# Patient Record
Sex: Female | Born: 1960 | Race: Black or African American | Hispanic: No | Smoking: Never smoker
Health system: Southern US, Community
[De-identification: ages and names within clinical notes are randomized; demographics above are authoritative.]

## PROBLEM LIST (undated history)

## (undated) DIAGNOSIS — K449 Diaphragmatic hernia without obstruction or gangrene: Secondary | ICD-10-CM

## (undated) DIAGNOSIS — I10 Essential (primary) hypertension: Secondary | ICD-10-CM

## (undated) HISTORY — PX: THYROID CYST EXCISION: SHX2511

---

## 1999-04-02 ENCOUNTER — Other Ambulatory Visit: Admission: RE | Admit: 1999-04-02 | Discharge: 1999-04-02 | Payer: Self-pay | Admitting: Obstetrics and Gynecology

## 2002-09-24 ENCOUNTER — Other Ambulatory Visit: Admission: RE | Admit: 2002-09-24 | Discharge: 2002-09-24 | Payer: Self-pay | Admitting: Obstetrics and Gynecology

## 2003-10-02 ENCOUNTER — Other Ambulatory Visit: Admission: RE | Admit: 2003-10-02 | Discharge: 2003-10-02 | Payer: Self-pay | Admitting: Obstetrics and Gynecology

## 2004-10-26 ENCOUNTER — Other Ambulatory Visit: Admission: RE | Admit: 2004-10-26 | Discharge: 2004-10-26 | Payer: Self-pay | Admitting: Obstetrics and Gynecology

## 2005-02-01 ENCOUNTER — Other Ambulatory Visit: Admission: RE | Admit: 2005-02-01 | Discharge: 2005-02-01 | Payer: Self-pay | Admitting: Obstetrics and Gynecology

## 2005-08-02 ENCOUNTER — Other Ambulatory Visit: Admission: RE | Admit: 2005-08-02 | Discharge: 2005-08-02 | Payer: Self-pay | Admitting: Obstetrics and Gynecology

## 2015-01-16 ENCOUNTER — Emergency Department (HOSPITAL_BASED_OUTPATIENT_CLINIC_OR_DEPARTMENT_OTHER): Payer: 59

## 2015-01-16 ENCOUNTER — Emergency Department (HOSPITAL_BASED_OUTPATIENT_CLINIC_OR_DEPARTMENT_OTHER)
Admission: EM | Admit: 2015-01-16 | Discharge: 2015-01-17 | Disposition: A | Discharge status: Home / Self Care | Payer: 59 | Attending: Emergency Medicine | Admitting: Emergency Medicine

## 2015-01-16 ENCOUNTER — Encounter (HOSPITAL_BASED_OUTPATIENT_CLINIC_OR_DEPARTMENT_OTHER): Payer: Self-pay

## 2015-01-16 DIAGNOSIS — K868 Other specified diseases of pancreas: Secondary | ICD-10-CM | POA: Insufficient documentation

## 2015-01-16 DIAGNOSIS — Z79899 Other long term (current) drug therapy: Secondary | ICD-10-CM | POA: Diagnosis not present

## 2015-01-16 DIAGNOSIS — I10 Essential (primary) hypertension: Secondary | ICD-10-CM | POA: Diagnosis not present

## 2015-01-16 DIAGNOSIS — K8689 Other specified diseases of pancreas: Secondary | ICD-10-CM

## 2015-01-16 DIAGNOSIS — R0781 Pleurodynia: Secondary | ICD-10-CM | POA: Insufficient documentation

## 2015-01-16 DIAGNOSIS — Z3202 Encounter for pregnancy test, result negative: Secondary | ICD-10-CM | POA: Insufficient documentation

## 2015-01-16 DIAGNOSIS — R1011 Right upper quadrant pain: Secondary | ICD-10-CM

## 2015-01-16 HISTORY — DX: Diaphragmatic hernia without obstruction or gangrene: K44.9

## 2015-01-16 HISTORY — DX: Essential (primary) hypertension: I10

## 2015-01-16 LAB — CBC
HEMATOCRIT: 41.6 % (ref 36.0–46.0)
HEMOGLOBIN: 13.9 g/dL (ref 12.0–15.0)
MCH: 26.9 pg (ref 26.0–34.0)
MCHC: 33.4 g/dL (ref 30.0–36.0)
MCV: 80.6 fL (ref 78.0–100.0)
PLATELETS: 264 10*3/uL (ref 150–400)
RBC: 5.16 MIL/uL — ABNORMAL HIGH (ref 3.87–5.11)
RDW: 14.9 % (ref 11.5–15.5)
WBC: 6.7 10*3/uL (ref 4.0–10.5)

## 2015-01-16 LAB — COMPREHENSIVE METABOLIC PANEL
ALBUMIN: 3.7 g/dL (ref 3.5–5.0)
ALT: 8 U/L — ABNORMAL LOW (ref 14–54)
AST: 16 U/L (ref 15–41)
Alkaline Phosphatase: 48 U/L (ref 38–126)
Anion gap: 7 (ref 5–15)
BILIRUBIN TOTAL: 0.3 mg/dL (ref 0.3–1.2)
BUN: 15 mg/dL (ref 6–20)
CO2: 26 mmol/L (ref 22–32)
Calcium: 8.8 mg/dL — ABNORMAL LOW (ref 8.9–10.3)
Chloride: 103 mmol/L (ref 101–111)
Creatinine, Ser: 0.89 mg/dL (ref 0.44–1.00)
GFR calc non Af Amer: >60 mL/min (ref >60)
Glucose, Bld: 158 mg/dL — ABNORMAL HIGH (ref 65–99)
POTASSIUM: 3.5 mmol/L (ref 3.5–5.1)
SODIUM: 136 mmol/L (ref 135–145)
Total Protein: 6.6 g/dL (ref 6.5–8.1)

## 2015-01-16 LAB — D-DIMER, QUANTITATIVE (NOT AT ARMC): D-Dimer, Quant: <0.27 ug/mL-FEU (ref 0.00–0.48)

## 2015-01-16 LAB — URINALYSIS, ROUTINE W REFLEX MICROSCOPIC
Bilirubin Urine: NEGATIVE
GLUCOSE, UA: NEGATIVE mg/dL
Hgb urine dipstick: NEGATIVE
KETONES UR: NEGATIVE mg/dL
Leukocytes, UA: NEGATIVE
NITRITE: NEGATIVE
Protein, ur: NEGATIVE mg/dL
Specific Gravity, Urine: 1.015 (ref 1.005–1.030)
UROBILINOGEN UA: 1 mg/dL (ref 0.0–1.0)
pH: 6.5 (ref 5.0–8.0)

## 2015-01-16 LAB — PREGNANCY, URINE: Preg Test, Ur: NEGATIVE

## 2015-01-16 LAB — LIPASE, BLOOD: LIPASE: 20 U/L — AB (ref 22–51)

## 2015-01-16 MED ORDER — ONDANSETRON HCL 4 MG/2ML IJ SOLN
4.0000 mg | Freq: Once | INTRAMUSCULAR | Status: AC
  Administered 2015-01-16: 4 mg via INTRAVENOUS
  Filled 2015-01-16: qty 2

## 2015-01-16 MED ORDER — IOHEXOL 300 MG/ML  SOLN
25.0000 mL | Freq: Once | INTRAMUSCULAR | Status: AC | PRN
  Administered 2015-01-16: 25 mL via ORAL

## 2015-01-16 MED ORDER — SODIUM CHLORIDE 0.9 % IV SOLN
Freq: Once | INTRAVENOUS | Status: AC
  Administered 2015-01-16: 23:00:00 via INTRAVENOUS

## 2015-01-16 MED ORDER — IOHEXOL 350 MG/ML SOLN
100.0000 mL | Freq: Once | INTRAVENOUS | Status: AC | PRN
  Administered 2015-01-16: 100 mL via INTRAVENOUS

## 2015-01-16 MED ORDER — MORPHINE SULFATE 4 MG/ML IJ SOLN
4.0000 mg | Freq: Once | INTRAMUSCULAR | Status: AC
Start: 2015-01-16 — End: 2015-01-16
  Administered 2015-01-16: 4 mg via INTRAVENOUS
  Filled 2015-01-16: qty 1

## 2015-01-16 MED ORDER — IOHEXOL 300 MG/ML  SOLN: 100.0000 mL | Freq: Once | INTRAMUSCULAR | Status: AC | PRN

## 2015-01-16 NOTE — ED Provider Notes (Signed)
CSN: [id number]     Arrival date & time 01/16/15  2111 History  This chart was scribed for Glynn Octave, MD by Budd Palmer, ED Scribe. This patient was seen in room MH03/MH03 and the patient's care was started at 10:09 PM.    Chief Complaint  Patient presents with  . Abdominal Pain   The history is provided by the patient. No language interpreter was used.   HPI Comments: Jasmine Lewis is a 54 y.o. female with a PMHx of HTN and hiatal hernia who presents to the Emergency Department complaining of worsening, intermittent, pressure-like RUQ abdominal pain onset 07/04. She reports associated nausea. During the day and while sitting it does not bother her, but movement, deep breathing, and eating exacerbate the pain. She has taken ibuprofen with moderate relief at onset, but then the pain returned. At this point she went to her PCP at [facility] where she had a US done, which was normal. Her PCP would like to do a CT scan. She has a PMHx of GERD. She is on an IUD for [facility]. Pt denies CP, vomiting, fever, diarrhea, dysuria, and hematuria. She has NKDA.  Past Medical History  Diagnosis Date  . Hypertension   . Hiatal hernia    Past Surgical History  Procedure Laterality Date  . Thyroid cyst excision     No family history on file. History  Substance Use Topics  . Smoking status: Never Smoker   . Smokeless tobacco: Not on file  . Alcohol Use: 2.4 oz/week    4 Glasses of wine per week   OB History    No data available     Review of Systems A complete 10 system review of systems was obtained and all systems are negative except as noted in the HPI and PMH.   Allergies  Review of patient's allergies indicates no known allergies.  Home Medications   Prior to Admission medications   Medication Sig Start Date End Date Taking? Authorizing Provider  HYDROcodone-acetaminophen (NORCO/VICODIN) 5-325 MG per tablet Take 1 tablet by mouth every 4 (four) hours as needed. 01/17/15    Glynn Octave, MD  metoprolol (LOPRESSOR) 50 MG tablet Take 50 mg by mouth 2 (two) times daily.   Yes Historical Provider, MD  omeprazole (PRILOSEC) 20 MG capsule Take 1 capsule (20 mg total) by mouth daily. 01/17/15   Glynn Octave, MD  ondansetron (ZOFRAN) 4 MG tablet Take 1 tablet (4 mg total) by mouth every 6 (six) hours. 01/17/15   Glynn Octave, MD  traMADol (ULTRAM) 50 MG tablet Take by mouth every 6 (six) hours as needed.   Yes Historical Provider, MD   BP 146/76 mmHg  Pulse 78  Temp(Src) 98.5 F (36.9 C) (Oral)  Resp 20  Ht 5\' 4"  (1.626 m)  Wt 170 lb (77.111 kg)  BMI 29.17 kg/m2  SpO2 96%  LMP 12/15/2014 Physical Exam  Constitutional: She is oriented to person, place, and time. She appears well-developed and well-nourished. No distress.  HENT:  Head: Normocephalic and atraumatic.  Mouth/Throat: Oropharynx is clear and moist. No oropharyngeal exudate.  Eyes: Conjunctivae and EOM are normal. Pupils are equal, round, and reactive to light.  Neck: Normal range of motion. Neck supple.  No meningismus.  Cardiovascular: Normal rate, regular rhythm, normal heart sounds and intact distal pulses.   No murmur heard. Pulmonary/Chest: Effort normal and breath sounds normal. No respiratory distress.  Abdominal: Soft. There is tenderness. There is guarding. There is no rebound.  RUQ tenderness with  guarding  Musculoskeletal: Normal range of motion. She exhibits tenderness. She exhibits no edema.  Tenderness to right lower ribs. No CVA tenderness.  Neurological: She is alert and oriented to person, place, and time. No cranial nerve deficit. She exhibits normal muscle tone. Coordination normal.  No ataxia on finger to nose bilaterally. No pronator drift. 5/5 strength throughout. CN 2-12 intact. Negative Romberg. Equal grip strength. Sensation intact. Gait is normal.   Skin: Skin is warm.  Psychiatric: She has a normal mood and affect. Her behavior is normal.  Nursing note and vitals  reviewed.   ED Course  Procedures  DIAGNOSTIC STUDIES: Oxygen Saturation is 98% on RA, normal by my interpretation.    COORDINATION OF CARE: 10:15 PM - Discussed plans to order diagnostic studies, as well as anti-nausea medication. Pt advised of plan for treatment and pt agrees.  Labs Review Labs Reviewed  LIPASE, BLOOD - Abnormal; Notable for the following:    Lipase 20 (*)    All other components within normal limits  COMPREHENSIVE METABOLIC PANEL - Abnormal; Notable for the following:    Glucose, Bld 158 (*)    Calcium 8.8 (*)    ALT 8 (*)    All other components within normal limits  CBC - Abnormal; Notable for the following:    RBC 5.16 (*)    All other components within normal limits  URINALYSIS, ROUTINE W REFLEX MICROSCOPIC (NOT AT [facility])  PREGNANCY, URINE  D-DIMER, QUANTITATIVE (NOT AT [facility])    Imaging Review Ct Angio Chest Pe W/cm &/or Wo Cm  01/16/2015   CLINICAL DATA:  Subacute onset of right upper quadrant abdominal pain for 17 days. Nausea. Initial encounter.  EXAM: CT ANGIOGRAPHY CHEST  CT ABDOMEN AND PELVIS WITH CONTRAST  TECHNIQUE: Multidetector CT imaging of the chest was performed using the standard protocol during bolus administration of intravenous contrast. Multiplanar CT image reconstructions and MIPs were obtained to evaluate the vascular anatomy. Multidetector CT imaging of the abdomen and pelvis was performed using the standard protocol during bolus administration of intravenous contrast.  CONTRAST:  OMNIPAQUE IOHEXOL 350 MG/ML SOLN  COMPARISON:  None.  FINDINGS: CTA CHEST FINDINGS  There is no evidence of pulmonary embolus.  Minimal bilateral dependent subsegmental atelectasis is noted. The lungs are otherwise clear. There is no evidence of significant focal consolidation, pleural effusion or pneumothorax. No masses are identified; no abnormal focal contrast enhancement is seen.  The mediastinum is unremarkable in appearance. No mediastinal  lymphadenopathy is seen. No pericardial effusion is identified. The great vessels are grossly unremarkable. No axillary lymphadenopathy is seen. The visualized portions of the thyroid gland are unremarkable in appearance.  The visualized portions of the liver are unremarkable.  CT ABDOMEN and PELVIS FINDINGS  A few small hypodensities are noted within the liver, measuring up to 5 mm in size. These are nonspecific. The spleen is unremarkable in appearance. The gallbladder is within normal limits.  There is dilatation of the pancreatic duct at the level of the pancreatic head to 6 mm in diameter, of uncertain significance. The remainder of the pancreatic duct is grossly unremarkable. The pancreas and adrenal glands are otherwise unremarkable in appearance.  Mild scarring is noted at the interpole region of the right kidney. The kidneys are otherwise unremarkable. There is no evidence of hydronephrosis. No renal or ureteral stones are seen. No perinephric stranding is appreciated.  No free fluid is identified. The small bowel is unremarkable in appearance. The stomach is within normal limits.  No acute vascular abnormalities are seen.  The appendix is normal in caliber and contains air, without evidence for appendicitis. The colon is unremarkable in appearance.  The bladder is mildly distended and grossly unremarkable. The uterus is significantly enlarged, containing multiple fibroids. An intrauterine device is noted in expected position at the fundus of the uterus. The right ovary is unremarkable in appearance. A 3.6 cm left adnexal cystic lesion is noted, nonspecific in appearance. No inguinal lymphadenopathy is seen.  No acute osseous abnormalities are identified.  Review of the MIP images confirms the above findings.  IMPRESSION: 1. No evidence of pulmonary embolus. 2. Dilatation of the pancreatic duct at the level of the pancreatic duct to 6 mm in diameter, of uncertain significance. The remainder of the pancreatic  duct is grossly unremarkable. Would correlate with pancreatic labs. 3. Minimal bilateral dependent subsegmental atelectasis noted; lungs otherwise clear. 4. Few small nonspecific hypodensities within the liver, measuring up to 5 mm in size. 5. Mild scarring at the interpole region of the right kidney. 6. Significantly enlarged fibroid uterus. Intrauterine device noted in expected position at the fundus of the uterus. 7. 3.6 cm left adnexal cystic lesion, nonspecific in appearance. Pelvic ultrasound could be considered for further evaluation, on an elective nonemergent basis.   Electronically Signed   By: Roanna Raider M.D.   On: 01/16/2015 23:42   Ct Abdomen Pelvis W Contrast  01/16/2015   CLINICAL DATA:  Subacute onset of right upper quadrant abdominal pain for 17 days. Nausea. Initial encounter.  EXAM: CT ANGIOGRAPHY CHEST  CT ABDOMEN AND PELVIS WITH CONTRAST  TECHNIQUE: Multidetector CT imaging of the chest was performed using the standard protocol during bolus administration of intravenous contrast. Multiplanar CT image reconstructions and MIPs were obtained to evaluate the vascular anatomy. Multidetector CT imaging of the abdomen and pelvis was performed using the standard protocol during bolus administration of intravenous contrast.  CONTRAST:  OMNIPAQUE IOHEXOL 350 MG/ML SOLN  COMPARISON:  None.  FINDINGS: CTA CHEST FINDINGS  There is no evidence of pulmonary embolus.  Minimal bilateral dependent subsegmental atelectasis is noted. The lungs are otherwise clear. There is no evidence of significant focal consolidation, pleural effusion or pneumothorax. No masses are identified; no abnormal focal contrast enhancement is seen.  The mediastinum is unremarkable in appearance. No mediastinal lymphadenopathy is seen. No pericardial effusion is identified. The great vessels are grossly unremarkable. No axillary lymphadenopathy is seen. The visualized portions of the thyroid gland are unremarkable in  appearance.  The visualized portions of the liver are unremarkable.  CT ABDOMEN and PELVIS FINDINGS  A few small hypodensities are noted within the liver, measuring up to 5 mm in size. These are nonspecific. The spleen is unremarkable in appearance. The gallbladder is within normal limits.  There is dilatation of the pancreatic duct at the level of the pancreatic head to 6 mm in diameter, of uncertain significance. The remainder of the pancreatic duct is grossly unremarkable. The pancreas and adrenal glands are otherwise unremarkable in appearance.  Mild scarring is noted at the interpole region of the right kidney. The kidneys are otherwise unremarkable. There is no evidence of hydronephrosis. No renal or ureteral stones are seen. No perinephric stranding is appreciated.  No free fluid is identified. The small bowel is unremarkable in appearance. The stomach is within normal limits. No acute vascular abnormalities are seen.  The appendix is normal in caliber and contains air, without evidence for appendicitis. The colon is unremarkable in appearance.  The bladder is mildly distended and grossly unremarkable. The uterus is significantly enlarged, containing multiple fibroids. An intrauterine device is noted in expected position at the fundus of the uterus. The right ovary is unremarkable in appearance. A 3.6 cm left adnexal cystic lesion is noted, nonspecific in appearance. No inguinal lymphadenopathy is seen.  No acute osseous abnormalities are identified.  Review of the MIP images confirms the above findings.  IMPRESSION: 1. No evidence of pulmonary embolus. 2. Dilatation of the pancreatic duct at the level of the pancreatic duct to 6 mm in diameter, of uncertain significance. The remainder of the pancreatic duct is grossly unremarkable. Would correlate with pancreatic labs. 3. Minimal bilateral dependent subsegmental atelectasis noted; lungs otherwise clear. 4. Few small nonspecific hypodensities within the  liver, measuring up to 5 mm in size. 5. Mild scarring at the interpole region of the right kidney. 6. Significantly enlarged fibroid uterus. Intrauterine device noted in expected position at the fundus of the uterus. 7. 3.6 cm left adnexal cystic lesion, nonspecific in appearance. Pelvic ultrasound could be considered for further evaluation, on an elective nonemergent basis.   Electronically Signed   By: Roanna Raider M.D.   On: 01/16/2015 23:42     EKG Interpretation   Date/Time:  Thursday January 16 2015 22:26:04 EDT Ventricular Rate:  76 PR Interval:  154 QRS Duration: 74 QT Interval:  388 QTC Calculation: 436 R Axis:   57 Text Interpretation:  Normal sinus rhythm Normal ECG No previous ECGs  available Confirmed by Yuritza Paulhus  MD, Allie Ousley [phone]) on 01/16/2015 10:52:18  PM      MDM   Final diagnoses:  RUQ pain  Dilated pancreatic duct  right upper quadrant pain since July 4 seen by PCP who did blood work. Ultrasound completed on July 19 showed no gallstones and no gallbladder wall thickening.  Patient reports severe pain with palpation and pain along her lower ribs worse with deep breathing.  LFTs and lipase normal. [place] reviewed from [facility] on 7/19.  No gallstones or gallbladder wall thickening.   CT findings d/w Dr. Randa Evens of GI.  He feels likely still gallbladder pathology and recommends HIDA scan. No evidence of cholangitis. Dilated pancreatic duct uncertain significance. Normal LFTs and lipase. Dr. Randa Evens feels patient would benefit from endoscopy and GI follow-up she may also need to have an MRI of her abdomen.  Will treat pain, start PPI, start anti-enteric. Discussed with patient. Return precautions discussed.  I personally performed the services described in this documentation, which was scribed in my presence. The recorded information has been reviewed and is accurate.   Glynn Octave, MD 01/17/15 [phone]

## 2015-01-16 NOTE — ED Notes (Signed)
Patient reports abdominal pain began 7/4 and was seen by her PCP who did blood work and had an [place] completed this week which was normal. Patient reports increased pain at night. Patient is taking Tramadol and prednisone for pain as well. Patient complains of RUQ pain.

## 2015-01-17 MED ORDER — ONDANSETRON HCL 4 MG PO TABS: 4.0000 mg | ORAL_TABLET | Freq: Four times a day (QID) | ORAL | Status: AC

## 2015-01-17 MED ORDER — OMEPRAZOLE 20 MG PO CPDR: 20.0000 mg | DELAYED_RELEASE_CAPSULE | Freq: Every day | ORAL | Status: AC

## 2015-01-17 MED ORDER — HYDROCODONE-ACETAMINOPHEN 5-325 MG PO TABS: 1.0000 | ORAL_TABLET | ORAL | Status: AC | PRN

## 2015-01-17 NOTE — Discharge Instructions (Signed)
Abdominal Pain As we discussed, follow up with the stomach doctor for a HIDA scan, EGD and possible MRI of your pancreas.  Take the prilosec as prescribed. Return to the ED if you develop new or worsening symptoms. Many things can cause abdominal pain. Usually, abdominal pain is not caused by a disease and will improve without treatment. It can often be observed and treated at home. Your health care provider will do a physical exam and possibly order blood tests and X-rays to help determine the seriousness of your pain. However, in many cases, more time must pass before a clear cause of the pain can be found. Before that point, your health care provider may not know if you need more testing or further treatment. HOME CARE INSTRUCTIONS  Monitor your abdominal pain for any changes. The following actions may help to alleviate any discomfort you are experiencing:  Only take over-the-counter or prescription medicines as directed by your health care provider.  Do not take laxatives unless directed to do so by your health care provider.  Try a clear liquid diet (broth, tea, or water) as directed by your health care provider. Slowly move to a bland diet as tolerated. SEEK MEDICAL CARE IF:  You have unexplained abdominal pain.  You have abdominal pain associated with nausea or diarrhea.  You have pain when you urinate or have a bowel movement.  You experience abdominal pain that wakes you in the night.  You have abdominal pain that is worsened or improved by eating food.  You have abdominal pain that is worsened with eating fatty foods.  You have a fever. SEEK IMMEDIATE MEDICAL CARE IF:   Your pain does not go away within 2 hours.  You keep throwing up (vomiting).  Your pain is felt only in portions of the abdomen, such as the right side or the left lower portion of the abdomen.  You pass bloody or black tarry stools. MAKE SURE YOU:  Understand these instructions.   Will watch your  condition.   Will get help right away if you are not doing well or get worse.  Document Released: 03/24/2005 Document Revised: 06/19/2013 Document Reviewed: 02/21/2013 [facility] Patient Information 2015 [place], [place]. This information is not intended to replace advice given to you by your health care provider. Make sure you discuss any questions you have with your health care provider.

## 2015-01-20 ENCOUNTER — Other Ambulatory Visit (HOSPITAL_COMMUNITY): Payer: Self-pay | Admitting: Gastroenterology

## 2015-01-20 DIAGNOSIS — R1011 Right upper quadrant pain: Secondary | ICD-10-CM

## 2015-01-27 ENCOUNTER — Ambulatory Visit (HOSPITAL_COMMUNITY)
Admission: RE | Admit: 2015-01-27 | Discharge: 2015-01-27 | Disposition: A | Discharge status: Home / Self Care | Payer: 59 | Source: Ambulatory Visit | Attending: Gastroenterology | Admitting: Gastroenterology

## 2015-01-27 DIAGNOSIS — R1011 Right upper quadrant pain: Secondary | ICD-10-CM

## 2015-01-27 MED ORDER — TECHNETIUM TC 99M MEBROFENIN IV KIT
5.0100 | PACK | Freq: Once | INTRAVENOUS | Status: AC | PRN
  Administered 2015-01-27: 5.01 via INTRAVENOUS

## 2016-10-09 IMAGING — CT CT ABD-PELV W/ CM
2 of 4 series · 12 of 32 positions shown, 17 images · IV contrast (APPLIED)
Comparison: None.

CLINICAL DATA: Subacute onset of right upper quadrant abdominal
pain for 17 days. Nausea. Initial encounter.

EXAM:
CT ANGIOGRAPHY CHEST
CT ABDOMEN AND PELVIS WITH CONTRAST
TECHNIQUE: Multidetector CT imaging of the chest was performed using the
standard protocol during bolus administration of intravenous
contrast. Multiplanar CT image reconstructions and MIPs were
obtained to evaluate the vascular anatomy. Multidetector CT imaging
of the abdomen and pelvis was performed using the standard protocol
during bolus administration of intravenous contrast.
CONTRAST:  100mL OMNIPAQUE IOHEXOL 350 MG/ML SOLN

[Series 6: pe 1.0 b26f · axial · 0.66mm/px · z∈[+636,+801]mm · 7 of 221 slices shown]
[im 14/221  soft-tissue]
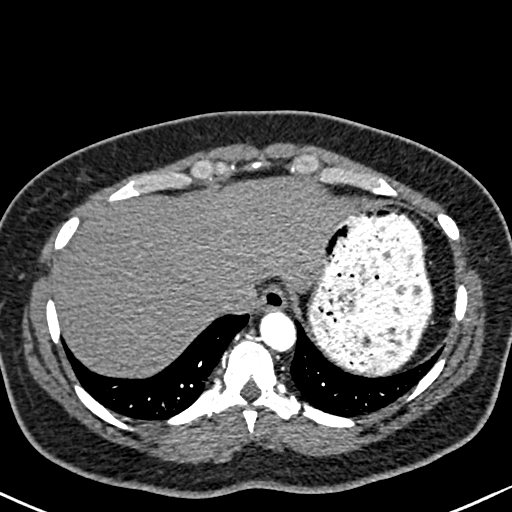
[im 42/221  soft-tissue]
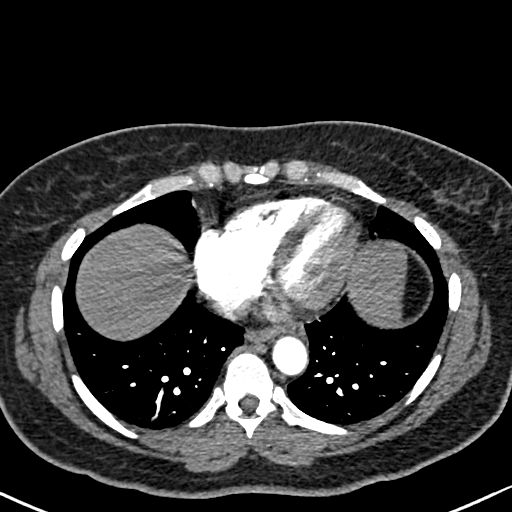
[im 69/221  soft-tissue]
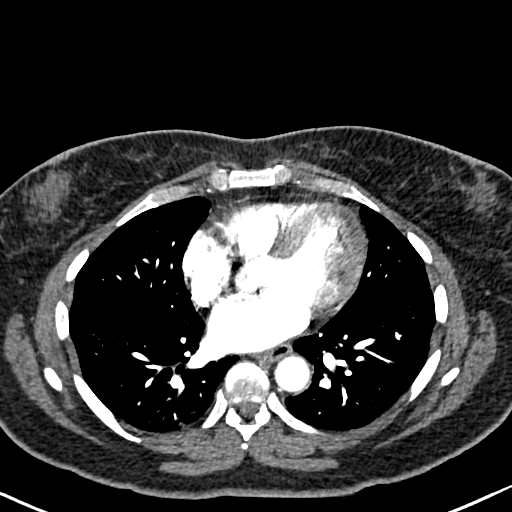
[im 97/221  soft-tissue]
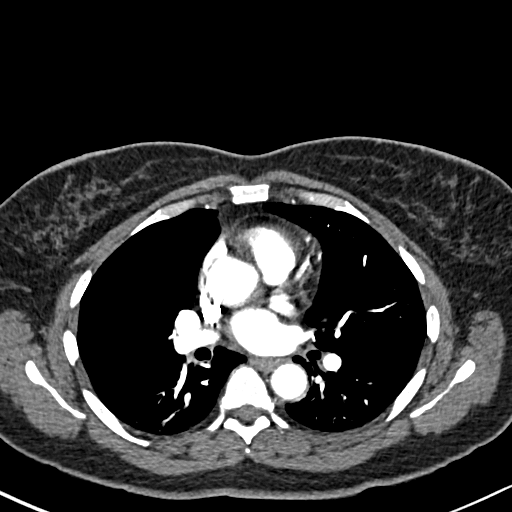
[im 124/221  soft-tissue]
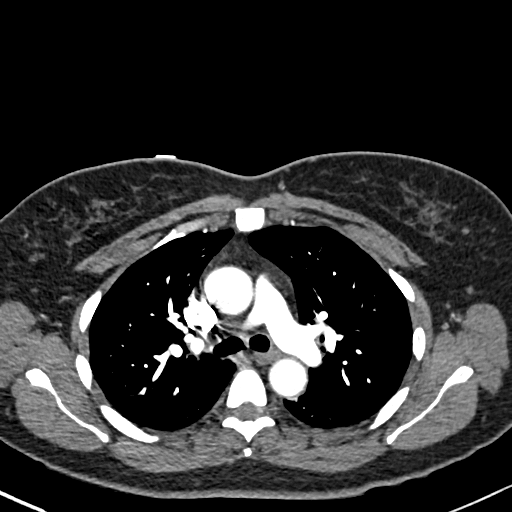
[im 152/221  soft-tissue]
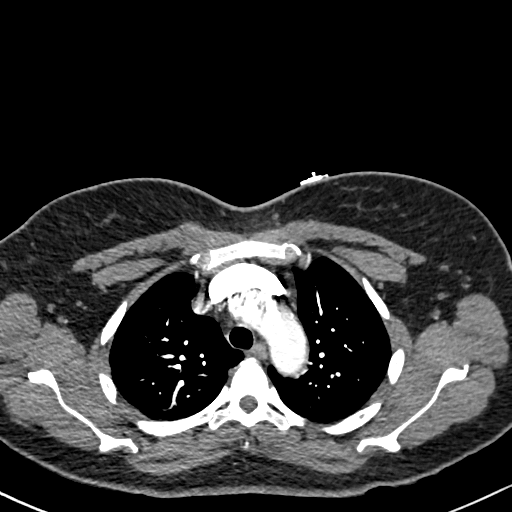
[im 179/221  soft-tissue]
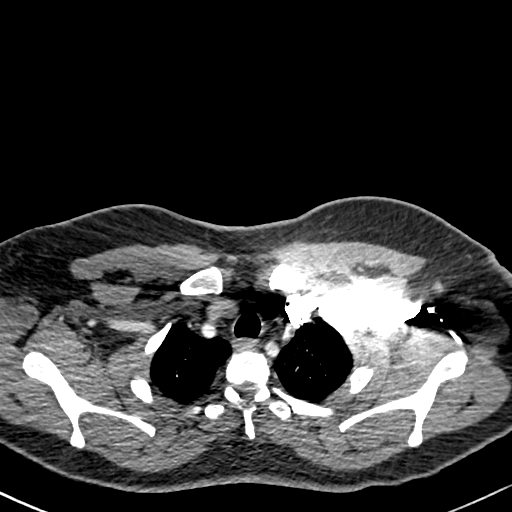

[Series 12: abd/pelvis 5.0 b31f · axial · 0.79mm/px · z∈[+303,+613]mm · 5 of 94 slices shown, 10 images]
[im 16/94  soft-tissue]
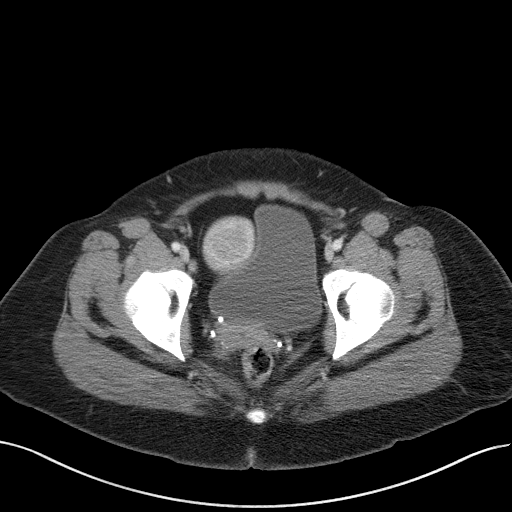
[im 16/94  bone]
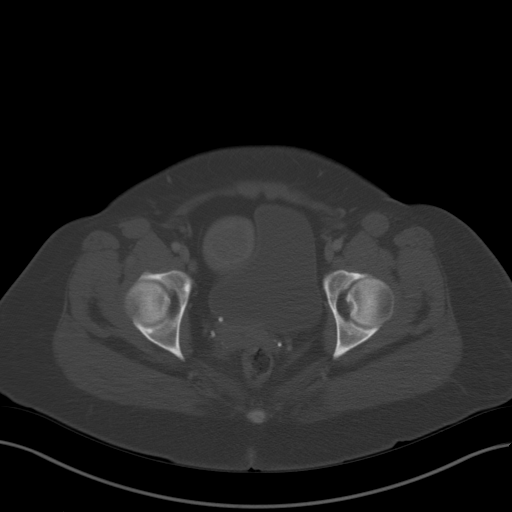
[im 32/94  soft-tissue]
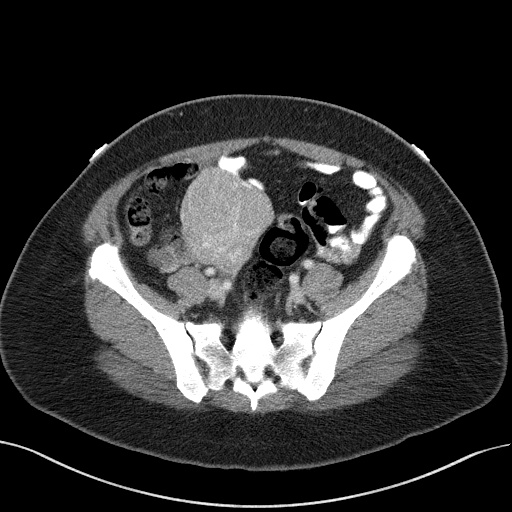
[im 32/94  lung]
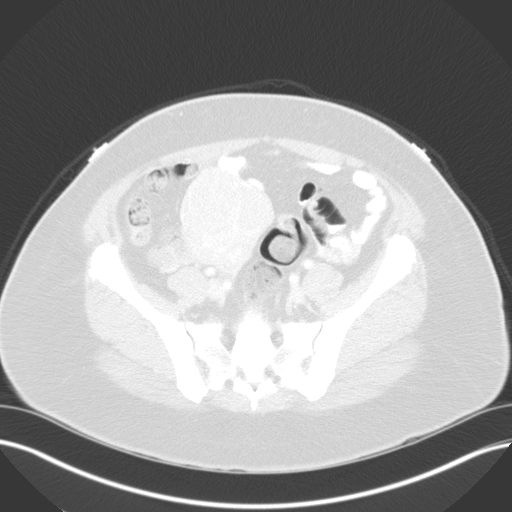
[im 47/94  soft-tissue]
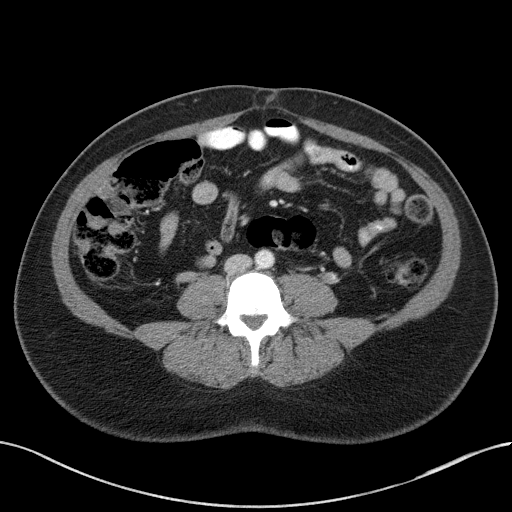
[im 47/94  lung]
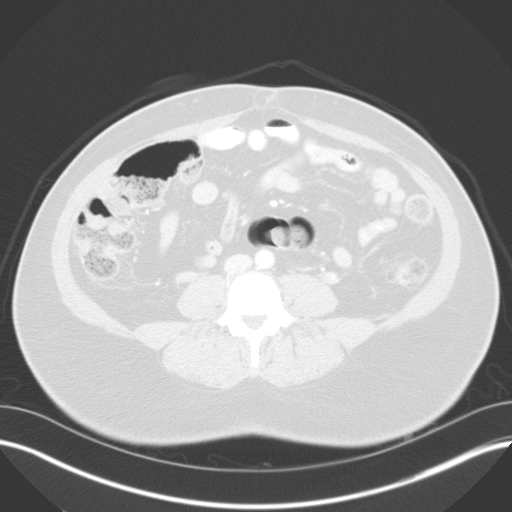
[im 63/94  soft-tissue]
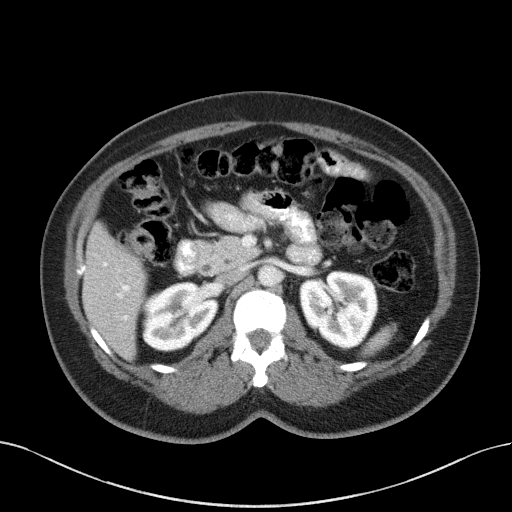
[im 63/94  lung]
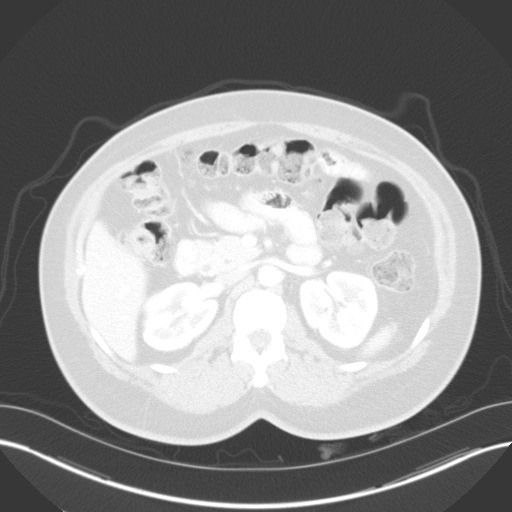
[im 78/94  soft-tissue]
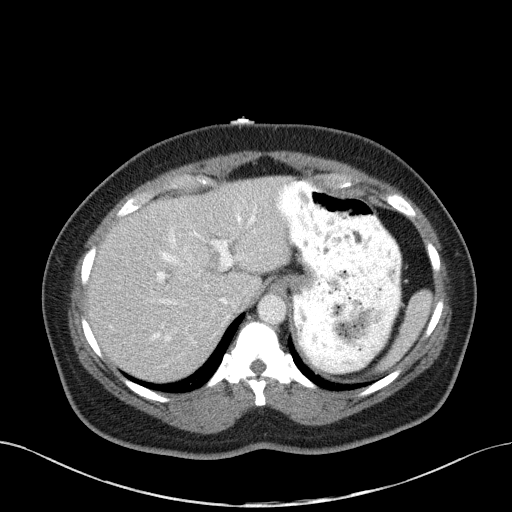
[im 78/94  lung]
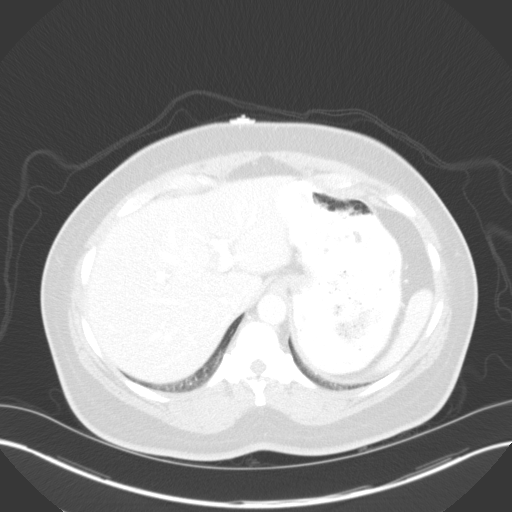

[12 of 32 positions shown; findings below may reference images not displayed]

FINDINGS: CTA CHEST FINDINGS

There is no evidence of pulmonary embolus.

Minimal bilateral dependent subsegmental atelectasis is noted. The
lungs are otherwise clear. There is no evidence of significant focal
consolidation, pleural effusion or pneumothorax. No masses are
identified; no abnormal focal contrast enhancement is seen.

The mediastinum is unremarkable in appearance. No mediastinal
lymphadenopathy is seen. No pericardial effusion is identified. The
great vessels are grossly unremarkable. No axillary lymphadenopathy
is seen. The visualized portions of the thyroid gland are
unremarkable in appearance.

The visualized portions of the liver are unremarkable.

CT ABDOMEN and PELVIS FINDINGS

A few small hypodensities are noted within the liver, measuring up
to 5 mm in size. These are nonspecific. The spleen is unremarkable
in appearance. The gallbladder is within normal limits.

There is dilatation of the pancreatic duct at the level of the
pancreatic head to 6 mm in diameter, of uncertain significance. The
remainder of the pancreatic duct is grossly unremarkable. The
pancreas and adrenal glands are otherwise unremarkable in
appearance.

Mild scarring is noted at the interpole region of the right kidney.
The kidneys are otherwise unremarkable. There is no evidence of
hydronephrosis. No renal or ureteral stones are seen. No perinephric
stranding is appreciated.

No free fluid is identified. The small bowel is unremarkable in
appearance. The stomach is within normal limits. No acute vascular
abnormalities are seen.

The appendix is normal in caliber and contains air, without evidence
for appendicitis. The colon is unremarkable in appearance.

The bladder is mildly distended and grossly unremarkable. The uterus
is significantly enlarged, containing multiple fibroids. An
intrauterine device is noted in expected position at the fundus of
the uterus. The right ovary is unremarkable in appearance. A 3.6 cm
left adnexal cystic lesion is noted, nonspecific in appearance. No
inguinal lymphadenopathy is seen.

No acute osseous abnormalities are identified.

Review of the MIP images confirms the above findings.
IMPRESSION: 1. No evidence of pulmonary embolus.
2. Dilatation of the pancreatic duct at the level of the pancreatic
duct to 6 mm in diameter, of uncertain significance. The remainder
of the pancreatic duct is grossly unremarkable. Would correlate with
pancreatic labs.
3. Minimal bilateral dependent subsegmental atelectasis noted; lungs
otherwise clear.
4. Few small nonspecific hypodensities within the liver, measuring
up to 5 mm in size.
5. Mild scarring at the interpole region of the right kidney.
6. Significantly enlarged fibroid uterus. Intrauterine device noted
in expected position at the fundus of the uterus.
7. 3.6 cm left adnexal cystic lesion, nonspecific in appearance.
Pelvic ultrasound could be considered for further evaluation, on an
elective nonemergent basis.
